# Patient Record
Sex: Male | Born: 1985 | Race: Black or African American | Hispanic: No | Marital: Single | Smoking: Never smoker
Health system: Southern US, Community
[De-identification: ages and names within clinical notes are randomized; demographics above are authoritative.]

## PROBLEM LIST (undated history)

## (undated) DIAGNOSIS — G43909 Migraine, unspecified, not intractable, without status migrainosus: Secondary | ICD-10-CM

## (undated) DIAGNOSIS — T7840XA Allergy, unspecified, initial encounter: Secondary | ICD-10-CM

---

## 2018-05-13 ENCOUNTER — Encounter: Payer: Self-pay | Admitting: Emergency Medicine

## 2018-05-13 ENCOUNTER — Emergency Department (INDEPENDENT_AMBULATORY_CARE_PROVIDER_SITE_OTHER)
Admission: EM | Admit: 2018-05-13 | Discharge: 2018-05-13 | Disposition: A | Discharge status: Home / Self Care | Payer: Worker's Compensation | Source: Home / Self Care | Attending: Family Medicine | Admitting: Family Medicine

## 2018-05-13 DIAGNOSIS — S29012A Strain of muscle and tendon of back wall of thorax, initial encounter: Secondary | ICD-10-CM

## 2018-05-13 DIAGNOSIS — M549 Dorsalgia, unspecified: Secondary | ICD-10-CM

## 2018-05-13 MED ORDER — MELOXICAM 15 MG PO TABS: 15.0000 mg | ORAL_TABLET | Freq: Every day | ORAL | 0 refills | Status: DC

## 2018-05-13 MED ORDER — METHOCARBAMOL 500 MG PO TABS: 500.0000 mg | ORAL_TABLET | Freq: Two times a day (BID) | ORAL | 0 refills | Status: DC

## 2018-05-13 NOTE — ED Triage Notes (Signed)
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

## 2018-05-13 NOTE — Discharge Instructions (Signed)
°  Meloxicam (Mobic) is an antiinflammatory to help with pain and inflammation.  Do not take ibuprofen, Advil, Aleve, or any other medications that contain NSAIDs while taking meloxicam as this may cause stomach upset or even ulcers if taken in large amounts for an extended period of time.   Robaxin (methocarbamol) is a muscle relaxer and may cause drowsiness. Do not drink alcohol, drive, or operate heavy machinery while taking.  Please call to schedule a follow up appointment with [facility] for reevaluation of symptoms to determine when you may return to work without restrictions.

## 2018-05-13 NOTE — ED Provider Notes (Signed)
[facility]    CSN: [id number] Arrival date & time: 05/13/18  [id number]     History   Chief Complaint Chief Complaint  Patient presents with  . Shoulder Pain    HPI Ian Jones is a 32 y.o. male.   HPI  Ian Jones is a 32 y.o. male presenting to UC with c/o Right shoulder pain around shoulder blade that started yesterday at work. Pt works for [employer] and was moving a pallet when he felt a tightness in his upper back.  He has taken Aleve but pain was worse this morning, he was unable to go to work due to the pain. Pain is 10/10 with certain movements, aching and sharp/cramping. Denies radiation of pain or numbness down Right arm. No prior hx of back injuries or surgeries.    History reviewed. No pertinent past medical history.  There are no active problems to display for this patient.   History reviewed. No pertinent surgical history.     Home Medications    Prior to Admission medications   Medication Sig Start Date End Date Taking? Authorizing Provider  Cetirizine HCl (ZYRTEC PO) Take by mouth.   Yes [provider]  fluticasone (FLONASE) 50 MCG/ACT nasal spray Place into both nostrils daily.   Yes [provider]  meloxicam (MOBIC) 15 MG tablet Take 1 tablet (15 mg total) by mouth daily. 05/13/18   Lurene ShadowPhelps, Shanise Balch O, PA-C  methocarbamol (ROBAXIN) 500 MG tablet Take 1 tablet (500 mg total) by mouth 2 (two) times daily. 05/13/18   Lurene ShadowPhelps, Deke Tilghman O, PA-C    Family History No family history on file.  Social History Social History   Tobacco Use  . Smoking status: Never Smoker  . Smokeless tobacco: Never Used  Substance Use Topics  . Alcohol use: Never    Frequency: Never  . Drug use: Never     Allergies   Patient has no known allergies.   Review of Systems Review of Systems  Musculoskeletal: Positive for arthralgias, myalgias and neck pain. Negative for joint swelling and neck stiffness.  Neurological: Negative for weakness and  numbness.     Physical Exam Triage Vital Signs ED Triage Vitals  Enc Vitals Group     BP 05/13/18 0953 (!) 141/83     Pulse Rate 05/13/18 0953 67     Resp --      Temp 05/13/18 0953 97.8 F (36.6 C)     Temp Source 05/13/18 0953 Oral     SpO2 05/13/18 0953 99 %     Weight 05/13/18 0954 (!) 322 lb (146.1 kg)     Height 05/13/18 0954 6\' 1"  (1.854 m)     Head Circumference --      Peak Flow --      Pain Score 05/13/18 0954 10     Pain Loc --      Pain Edu? --      Excl. in GC? --    No data found.  Updated Vital Signs BP (!) 141/83 (BP Location: Right Arm)   Pulse 67   Temp 97.8 F (36.6 C) (Oral)   Ht 6\' 1"  (1.854 m)   Wt (!) 322 lb (146.1 kg)   SpO2 99%   BMI 42.48 kg/m   Visual Acuity Right Eye Distance:   Left Eye Distance:   Bilateral Distance:    Right Eye Near:   Left Eye Near:    Bilateral Near:     Physical Exam  Constitutional: He  is oriented to person, place, and time. He appears well-developed and well-nourished.  HENT:  Head: Normocephalic and atraumatic.  Eyes: EOM are normal.  Neck: Normal range of motion. Neck supple.  No midline bone tenderness, no crepitus or step-offs.   Cardiovascular: Normal rate.  Pulses:      Radial pulses are 2+ on the right side.  Pulmonary/Chest: Effort normal.  Musculoskeletal: Normal range of motion. He exhibits tenderness. He exhibits no edema.  No midline spinal tenderness. Tenderness to Right side rhomboid muscles and Right upper trapezius.  Full ROM Right arm. No bony tenderness of Right shoulder.  5/5 strength in Right arm  Neurological: He is alert and oriented to person, place, and time.  Skin: Skin is warm and dry.  Psychiatric: He has a normal mood and affect. His behavior is normal.  Nursing note and vitals reviewed.    UC Treatments / Results  Labs (all labs ordered are listed, but only abnormal results are displayed) Labs Reviewed - No data to display  EKG None  Radiology No results  found.  Procedures Procedures (including critical care time)  Medications Ordered in UC Medications - No data to display  Initial Impression / Assessment and Plan / UC Course  I have reviewed the triage vital signs and the nursing notes.  Pertinent labs & imaging results that were available during my care of the patient were reviewed by me and considered in my medical decision making (see chart for details).     No imaging indicated at this time. Hx and exam c/w muscle strain. Home instructions provided. Pt may return to work tomorrow with light duty.  Pt to f/u with Employee Health at [facility] on Monday or Tuesday (9/23-9/24).  Final Clinical Impressions(s) / UC Diagnoses   Final diagnoses:  Upper back pain on right side  Rhomboid muscle strain, initial encounter     Discharge Instructions      Meloxicam (Mobic) is an antiinflammatory to help with pain and inflammation.  Do not take ibuprofen, Advil, Aleve, or any other medications that contain NSAIDs while taking meloxicam as this may cause stomach upset or even ulcers if taken in large amounts for an extended period of time.   Robaxin (methocarbamol) is a muscle relaxer and may cause drowsiness. Do not drink alcohol, drive, or operate heavy machinery while taking.  Please call to schedule a follow up appointment with Employee Health at [facility] for reevaluation of symptoms to determine when you may return to work without restrictions.     ED Prescriptions    Medication Sig Dispense Auth. Provider   methocarbamol (ROBAXIN) 500 MG tablet Take 1 tablet (500 mg total) by mouth 2 (two) times daily. 20 tablet Lurene Shadow, PA-C   meloxicam (MOBIC) 15 MG tablet Take 1 tablet (15 mg total) by mouth daily. 20 tablet Lurene Shadow, PA-C     Controlled Substance Prescriptions [place] Controlled Substance Registry consulted? Not Applicable   Rolla Plate 05/13/18 1136

## 2019-10-07 ENCOUNTER — Emergency Department
Admission: EM | Admit: 2019-10-07 | Discharge: 2019-10-07 | Discharge status: Against Medical Advice | Payer: Self-pay | Source: Home / Self Care

## 2019-10-07 ENCOUNTER — Other Ambulatory Visit: Payer: Self-pay

## 2019-11-22 ENCOUNTER — Encounter (HOSPITAL_COMMUNITY): Payer: Self-pay

## 2019-11-22 ENCOUNTER — Other Ambulatory Visit: Payer: Self-pay

## 2019-11-22 ENCOUNTER — Ambulatory Visit (HOSPITAL_COMMUNITY)
Admission: EM | Admit: 2019-11-22 | Discharge: 2019-11-22 | Disposition: A | Discharge status: Home / Self Care | Payer: Self-pay | Attending: Family Medicine | Admitting: Family Medicine

## 2019-11-22 DIAGNOSIS — S161XXA Strain of muscle, fascia and tendon at neck level, initial encounter: Secondary | ICD-10-CM

## 2019-11-22 MED ORDER — CYCLOBENZAPRINE HCL 10 MG PO TABS: 10.0000 mg | ORAL_TABLET | Freq: Two times a day (BID) | ORAL | 0 refills | Status: AC | PRN

## 2019-11-22 MED ORDER — IBUPROFEN 800 MG PO TABS: 800.0000 mg | ORAL_TABLET | Freq: Three times a day (TID) | ORAL | 0 refills | Status: AC
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
## 2020-01-27 ENCOUNTER — Emergency Department (INDEPENDENT_AMBULATORY_CARE_PROVIDER_SITE_OTHER): Payer: Commercial Managed Care - PPO

## 2020-01-27 ENCOUNTER — Emergency Department (INDEPENDENT_AMBULATORY_CARE_PROVIDER_SITE_OTHER)
Admission: EM | Admit: 2020-01-27 | Discharge: 2020-01-27 | Disposition: A | Discharge status: Home / Self Care | Payer: Commercial Managed Care - PPO | Source: Home / Self Care | Attending: Family Medicine | Admitting: Family Medicine

## 2020-01-27 ENCOUNTER — Other Ambulatory Visit: Payer: Self-pay

## 2020-01-27 DIAGNOSIS — R6884 Jaw pain: Secondary | ICD-10-CM

## 2020-01-27 DIAGNOSIS — M940 Chondrocostal junction syndrome [Tietze]: Secondary | ICD-10-CM | POA: Diagnosis not present

## 2020-01-27 DIAGNOSIS — M542 Cervicalgia: Secondary | ICD-10-CM | POA: Diagnosis not present

## 2020-01-27 DIAGNOSIS — R0789 Other chest pain: Secondary | ICD-10-CM

## 2020-01-27 MED ORDER — PREDNISONE 20 MG PO TABS: ORAL_TABLET | ORAL | 0 refills | Status: AC

## 2020-01-27 MED ORDER — PREDNISONE 20 MG PO TABS: ORAL_TABLET | ORAL | 0 refills | Status: DC

## 2020-01-27 NOTE — ED Triage Notes (Signed)
Pt c/o sharp pain in lower LT side of jaw, radiates down to shoulder blade since last night. Resolved this am. Has returned while working. Aleve prn. Pt was in [facility] March 1, says hes felt intermittent pain since then. Still currently in PT. Works for [employer]  and does strenuous activities at work.
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
## 2020-01-27 NOTE — Discharge Instructions (Addendum)
Apply ice pack to back of neck, and to upper left chest and sternum for 20 to 30 minutes, 2 to 3 times daily  Continue until pain decreases.   May take Tylenol as needed for pain.

## 2020-02-05 ENCOUNTER — Encounter: Payer: Commercial Managed Care - PPO | Admitting: Sports Medicine

## 2020-06-02 ENCOUNTER — Other Ambulatory Visit: Payer: Self-pay

## 2020-06-02 ENCOUNTER — Encounter (HOSPITAL_COMMUNITY): Payer: Self-pay | Admitting: Emergency Medicine

## 2020-06-02 ENCOUNTER — Emergency Department (HOSPITAL_COMMUNITY)
Admission: EM | Admit: 2020-06-02 | Discharge: 2020-06-03 | Disposition: A | Discharge status: Home / Self Care | Payer: Commercial Managed Care - PPO | Attending: Emergency Medicine | Admitting: Emergency Medicine

## 2020-06-02 DIAGNOSIS — R0981 Nasal congestion: Secondary | ICD-10-CM | POA: Diagnosis present

## 2020-06-02 DIAGNOSIS — U071 COVID-19: Secondary | ICD-10-CM | POA: Insufficient documentation

## 2020-06-02 DIAGNOSIS — R5383 Other fatigue: Secondary | ICD-10-CM

## 2020-06-02 LAB — RESPIRATORY PANEL BY RT PCR (FLU A&B, COVID)
Influenza A by PCR: NEGATIVE
Influenza B by PCR: NEGATIVE
SARS Coronavirus 2 by RT PCR: POSITIVE — AB

## 2020-06-02 MED ORDER — FLUTICASONE PROPIONATE 50 MCG/ACT NA SUSP: 2.0000 | Freq: Two times a day (BID) | NASAL | 0 refills | Status: AC

## 2020-06-02 MED ORDER — ONDANSETRON 4 MG PO TBDP
4.0000 mg | ORAL_TABLET | Freq: Once | ORAL | Status: AC
  Administered 2020-06-03: 4 mg via ORAL
  Filled 2020-06-02: qty 1

## 2020-06-02 MED ORDER — ONDANSETRON 8 MG PO TBDP: 8.0000 mg | ORAL_TABLET | Freq: Once | ORAL | Status: DC

## 2020-06-02 MED ORDER — ACETAMINOPHEN 325 MG PO TABS
650.0000 mg | ORAL_TABLET | Freq: Once | ORAL | Status: AC
  Administered 2020-06-03: 650 mg via ORAL
  Filled 2020-06-02: qty 2

## 2020-06-02 MED ORDER — CETIRIZINE HCL 10 MG PO TABS: 10.0000 mg | ORAL_TABLET | Freq: Every day | ORAL | 0 refills | Status: AC

## 2020-06-02 MED ORDER — ONDANSETRON 4 MG PO TBDP: 4.0000 mg | ORAL_TABLET | Freq: Three times a day (TID) | ORAL | 0 refills | Status: AC | PRN

## 2020-06-02 NOTE — ED Provider Notes (Addendum)
[facility]-EMERGENCY DEPT Provider Note   CSN: [id number] Arrival date & time: 06/02/20  2014     History Chief Complaint  Patient presents with  . Nasal Congestion  . Fatigue    Ian Jones is a 34 y.o. male.  HPI  Patient is overweight 34 year old male with no pertinent past medical history presented today with 3 days of congestion, some chills, some fatigue and myalgias.  Denies any shortness of breath.  States he is not coughing.  Denies any chest pain or lightheadedness or dizziness.  He states that he is not vaccinated for COVID-19.  He denies any fevers although he did say that he felt somewhat sweaty the other night and has had some chills.  He states he has had persistent nausea but no vomiting no abdominal pain no lightheadedness or dizziness.  He states he is otherwise feeling quite well.  Denies any aggravating or mitigating factors of his symptoms other than some improvement with his congestion when using fluticasone which he has used 1 spray in each nostril each morning.  He ran out yesterday morning and states that his congestion is a little worse today.  He has no known exposure to Covid.      History reviewed. No pertinent past medical history.  There are no problems to display for this patient.   History reviewed. No pertinent surgical history.     History reviewed. No pertinent family history.  Social History   Tobacco Use  . Smoking status: Never Smoker  . Smokeless tobacco: Never Used  Substance Use Topics  . Alcohol use: Never  . Drug use: Never    Home Medications Prior to Admission medications   Medication Sig Start Date End Date Taking? Authorizing Provider  cetirizine (ZYRTEC ALLERGY) 10 MG tablet Take 1 tablet (10 mg total) by mouth daily for 14 days. 06/02/20 06/16/20  Gailen Shelter, PA  cyclobenzaprine (FLEXERIL) 10 MG tablet Take 1 tablet (10 mg total) by mouth 2 (two) times daily as needed for muscle spasms.  11/22/19   Bast, Gloris Manchester A, NP  fluticasone (FLONASE) 50 MCG/ACT nasal spray Place 2 sprays into both nostrils in the morning and at bedtime for 14 days. 06/02/20 06/16/20  Gailen Shelter, PA  ibuprofen (ADVIL) 800 MG tablet Take 1 tablet (800 mg total) by mouth 3 (three) times daily. 11/22/19   Bast, Gloris Manchester A, NP  ondansetron (ZOFRAN ODT) 4 MG disintegrating tablet Take 1 tablet (4 mg total) by mouth every 8 (eight) hours as needed for nausea or vomiting. 06/02/20   Gailen Shelter, PA  predniSONE (DELTASONE) 20 MG tablet Take one tab by mouth twice daily for 4 days, then one daily for 3 days. Take with food. 01/27/20   Lattie Haw, MD    Allergies    Patient has no known allergies.  Review of Systems   Review of Systems  Constitutional: Positive for chills and fatigue. Negative for fever.  HENT: Positive for congestion.   Eyes: Negative for pain.  Respiratory: Negative for cough and shortness of breath.   Cardiovascular: Negative for chest pain and leg swelling.  Gastrointestinal: Positive for nausea. Negative for abdominal pain, diarrhea and vomiting.  Genitourinary: Negative for dysuria.  Musculoskeletal: Positive for myalgias.  Skin: Negative for rash.  Neurological: Negative for dizziness and headaches.    Physical Exam Updated Vital Signs BP (!) 135/93 (BP Location: Left Arm)   Pulse 96   Temp 99.9 F (37.7 C) (Oral)  Resp 16   Wt (!) 139.7 kg   SpO2 98%   BMI 40.64 kg/m   Physical Exam Vitals and nursing note reviewed.  Constitutional:      General: He is not in acute distress.    Appearance: He is obese. He is not ill-appearing.  HENT:     Head: Normocephalic and atraumatic.     Nose: Congestion present.  Eyes:     General: No scleral icterus. Cardiovascular:     Rate and Rhythm: Normal rate and regular rhythm.     Pulses: Normal pulses.     Heart sounds: Normal heart sounds.  Pulmonary:     Effort: Pulmonary effort is normal. No respiratory distress.      Breath sounds: Normal breath sounds. No wheezing, rhonchi or rales.  Abdominal:     Palpations: Abdomen is soft.     Tenderness: There is no abdominal tenderness. There is no right CVA tenderness, left CVA tenderness, guarding or rebound.  Musculoskeletal:     Cervical back: Normal range of motion.     Right lower leg: No edema.     Left lower leg: No edema.  Skin:    General: Skin is warm and dry.     Capillary Refill: Capillary refill takes less than 2 seconds.  Neurological:     Mental Status: He is alert. Mental status is at baseline.  Psychiatric:        Mood and Affect: Mood normal.        Behavior: Behavior normal.     ED Results / Procedures / Treatments   Labs (all labs ordered are listed, but only abnormal results are displayed) Labs Reviewed  RESPIRATORY PANEL BY RT PCR (FLU A&B, COVID)    EKG None  Radiology No results found.  Procedures Procedures (including critical care time)  Medications Ordered in ED Medications  ondansetron (ZOFRAN-ODT) disintegrating tablet 4 mg (has no administration in time range)    ED Course  I have reviewed the triage vital signs and the nursing notes.  Pertinent labs & imaging results that were available during my care of the patient were reviewed by me and considered in my medical decision making (see chart for details).  Patient with 3 days of congestion, some chills, some fatigue and myalgias.  Denies any shortness of breath.  States he is not coughing.  Denies any chest pain or lightheadedness or dizziness.  Physical exam is unremarkable.  He is well-appearing is overweight.  Signs without abnormality apart from very mild hypertension.  Patient given Tylenol and Zofran.  He will be discharged with conservative therapy including Zofran, fluticasone, Zyrtec as well as Tylenol ibuprofen recommendations.  Clinical Course as of Jun 02 2329  Mon Jun 02, 2020  2329 COVID positive. No hypoxia.  Patient ambulated without  dyspnea. Mild elevation in temperature.  Patient will take Tylenol and ibuprofen.  Respiratory Panel by RT PCR (Flu A&B, Covid) - Nasopharyngeal Swab(!) [WF]    Clinical Course User Index [WF] Gailen Shelter, [place]   MDM Rules/Calculators/A&P                          Ian Jones was evaluated in Emergency Department on 06/02/2020 for the symptoms described in the history of present illness. He was evaluated in the context of the global COVID-19 pandemic, which necessitated consideration that the patient might be at risk for infection with the SARS-CoV-2 virus that causes COVID-19. Institutional protocols and  algorithms that pertain to the evaluation of patients at risk for COVID-19 are in a state of rapid change based on information released by regulatory bodies including the CDC and federal and state organizations. These policies and algorithms were followed during the patient's care in the ED.  Discussed with patient the option for MAB infusion.  He states he would like to be considered for this.  Will reach out to MAB infusion center.  Given strict return precautions.  Final Clinical Impression(s) / ED Diagnoses Final diagnoses:  Fatigue, unspecified type  Nasal congestion  Suspected COVID-19 virus infection    Rx / DC Orders ED Discharge Orders         Ordered    ondansetron (ZOFRAN ODT) 4 MG disintegrating tablet  Every 8 hours PRN        06/02/20 2320    fluticasone (FLONASE) 50 MCG/ACT nasal spray  2 times daily        06/02/20 2320    cetirizine (ZYRTEC ALLERGY) 10 MG tablet  Daily        06/02/20 2320           Gailen Shelter, [place] 06/02/20 2332    Gailen Shelter, [place] 06/02/20 2337    Benjiman Core, MD 06/03/20 [phone]

## 2020-06-02 NOTE — ED Triage Notes (Signed)
Pt states, "I have had flu like symptoms" since Saturday. He reports post nasal drip, body aches and fatigue. Denies sick contacts. Not vaccinated against COVID 19.

## 2020-06-02 NOTE — ED Notes (Signed)
Date and time results received: 06/02/20 11:24 PM  (use smartphrase ".now" to insert current time)  Test: SARS Covid Critical Value: Positive  Name of Provider Notified: Dr.Pickering  Orders Received? Or Actions Taken?:

## 2020-06-02 NOTE — Discharge Instructions (Addendum)
Your COVID test is pending; you should expect results in 2-3 hours. You can access your results on your MyChart--if you test positive you should receive a phone call.  In the meantime follow CDC guidelines and quarantine, wear a mask, wash hands often.   Please use Tylenol or ibuprofen for pain.  You may use 600 mg ibuprofen every 6 hours or 1000 mg of Tylenol every 6 hours.  You may choose to alternate between the 2.  This would be most effective.  Not to exceed 4 g of Tylenol within 24 hours.  Not to exceed 3200 mg ibuprofen 24 hours.   Please use the Zyrtec, Flonase and Zyrtec that I prescribed as needed for your symptoms.  Please take over the counter vitamin D 2000-4000 units per day. I also recommend zinc 50 mg per day for the next two weeks.   Please return to ED if you feel have difficulty breathing or have emergent, new or concerning symptoms.  Patients who have symptoms consistent with COVID-19 should self isolated for: At least 3 days (72 hours) have passed since recovery, defined as resolution of fever without the use of fever reducing medications and improvement in respiratory symptoms (e.g., cough, shortness of breath), and At least 7 days have passed since symptoms first appeared.       Person Under Monitoring Name: Ian Jones  Location: [address] [place] [address]   Infection Prevention Recommendations for Individuals Confirmed to have, or Being Evaluated for, 2019 Novel Coronavirus (COVID-19) Infection Who Receive Care at Home  Individuals who are confirmed to have, or are being evaluated for, COVID-19 should follow the prevention steps below until a healthcare provider or local or state health department says they can return to normal activities.  Stay home except to get medical care You should restrict activities outside your home, except for getting medical care. Do not go to work, school, or public areas, and do not use public  transportation or taxis.  Call ahead before visiting your doctor Before your medical appointment, call the healthcare provider and tell them that you have, or are being evaluated for, COVID-19 infection. This will help the healthcare provider's office take steps to keep other people from getting infected. Ask your healthcare provider to call the local or state health department.  Monitor your symptoms Seek prompt medical attention if your illness is worsening (e.g., difficulty breathing). Before going to your medical appointment, call the healthcare provider and tell them that you have, or are being evaluated for, COVID-19 infection. Ask your healthcare provider to call the local or state health department.  Wear a facemask You should wear a facemask that covers your nose and mouth when you are in the same room with other people and when you visit a healthcare provider. People who live with or visit you should also wear a facemask while they are in the same room with you.  Separate yourself from other people in your home As much as possible, you should stay in a different room from other people in your home. Also, you should use a separate bathroom, if available.  Avoid sharing household items You should not share dishes, drinking glasses, cups, eating utensils, towels, bedding, or other items with other people in your home. After using these items, you should wash them thoroughly with soap and water.  Cover your coughs and sneezes Cover your mouth and nose with a tissue when you cough or sneeze, or you can cough or  sneeze into your sleeve. Throw used tissues in a lined trash can, and immediately wash your hands with soap and water for at least 20 seconds or use an alcohol-based hand rub.  Wash your [employer] your hands often and thoroughly with soap and water for at least 20 seconds. You can use an alcohol-based hand sanitizer if soap and water are not available and if your hands  are not visibly dirty. Avoid touching your eyes, nose, and mouth with unwashed hands.   Prevention Steps for Caregivers and Household Members of Individuals Confirmed to have, or Being Evaluated for, COVID-19 Infection Being Cared for in the Home  If you live with, or provide care at home for, a person confirmed to have, or being evaluated for, COVID-19 infection please follow these guidelines to prevent infection:  Follow healthcare provider's instructions Make sure that you understand and can help the patient follow any healthcare provider instructions for all care.  Provide for the patient's basic needs You should help the patient with basic needs in the home and provide support for getting groceries, prescriptions, and other personal needs.  Monitor the patient's symptoms If they are getting sicker, call his or her medical provider and tell them that the patient has, or is being evaluated for, COVID-19 infection. This will help the healthcare provider's office take steps to keep other people from getting infected. Ask the healthcare provider to call the local or state health department.  Limit the number of people who have contact with the patient If possible, have only one caregiver for the patient. Other household members should stay in another home or place of residence. If this is not possible, they should stay in another room, or be separated from the patient as much as possible. Use a separate bathroom, if available. Restrict visitors who do not have an essential need to be in the home.  Keep older adults, very young children, and other sick people away from the patient Keep older adults, very young children, and those who have compromised immune systems or chronic health conditions away from the patient. This includes people with chronic heart, lung, or kidney conditions, diabetes, and cancer.  Ensure good ventilation Make sure that shared spaces in the home have good air  flow, such as from an air conditioner or an opened window, weather permitting.  Wash your hands often Wash your hands often and thoroughly with soap and water for at least 20 seconds. You can use an alcohol based hand sanitizer if soap and water are not available and if your hands are not visibly dirty. Avoid touching your eyes, nose, and mouth with unwashed hands. Use disposable paper towels to dry your hands. If not available, use dedicated cloth towels and replace them when they become wet.  Wear a facemask and gloves Wear a disposable facemask at all times in the room and gloves when you touch or have contact with the patient's blood, body fluids, and/or secretions or excretions, such as sweat, saliva, sputum, nasal mucus, vomit, urine, or feces.  Ensure the mask fits over your nose and mouth tightly, and do not touch it during use. Throw out disposable facemasks and gloves after using them. Do not reuse. Wash your hands immediately after removing your facemask and gloves. If your personal clothing becomes contaminated, carefully remove clothing and launder. Wash your hands after handling contaminated clothing. Place all used disposable facemasks, gloves, and other waste in a lined container before disposing them with other household waste. Remove  gloves and wash your hands immediately after handling these items.  Do not share dishes, glasses, or other household items with the patient Avoid sharing household items. You should not share dishes, drinking glasses, cups, eating utensils, towels, bedding, or other items with a patient who is confirmed to have, or being evaluated for, COVID-19 infection. After the person uses these items, you should wash them thoroughly with soap and water.  Wash laundry thoroughly Immediately remove and wash clothes or bedding that have blood, body fluids, and/or secretions or excretions, such as sweat, saliva, sputum, nasal mucus, vomit, urine, or feces, on  them. Wear gloves when handling laundry from the patient. Read and follow directions on labels of laundry or clothing items and detergent. In general, wash and dry with the warmest temperatures recommended on the label.  Clean all areas the individual has used often Clean all touchable surfaces, such as counters, tabletops, doorknobs, bathroom fixtures, toilets, phones, keyboards, tablets, and bedside tables, every day. Also, clean any surfaces that may have blood, body fluids, and/or secretions or excretions on them. Wear gloves when cleaning surfaces the patient has come in contact with. Use a diluted bleach solution (e.g., dilute bleach with 1 part bleach and 10 parts water) or a household disinfectant with a label that says EPA-registered for coronaviruses. To make a bleach solution at home, add 1 tablespoon of bleach to 1 quart (4 cups) of water. For a larger supply, add  cup of bleach to 1 gallon (16 cups) of water. Read labels of cleaning products and follow recommendations provided on product labels. Labels contain instructions for safe and effective use of the cleaning product including precautions you should take when applying the product, such as wearing gloves or eye protection and making sure you have good ventilation during use of the product. Remove gloves and wash hands immediately after cleaning.  Monitor yourself for signs and symptoms of illness Caregivers and household members are considered close contacts, should monitor their health, and will be asked to limit movement outside of the home to the extent possible. Follow the monitoring steps for close contacts listed on the symptom monitoring form.   ? If you have additional questions, contact your local health department or call the epidemiologist on call at [phone] (available 24/7). ? This guidance is subject to change. For the most up-to-date guidance from [facility], please refer to their  website: [email or web address]

## 2020-06-03 ENCOUNTER — Encounter: Payer: Self-pay | Admitting: Nurse Practitioner

## 2020-06-03 ENCOUNTER — Other Ambulatory Visit: Payer: Self-pay | Admitting: Nurse Practitioner

## 2020-06-03 ENCOUNTER — Telehealth (HOSPITAL_COMMUNITY): Payer: Self-pay

## 2020-06-03 DIAGNOSIS — U071 COVID-19: Secondary | ICD-10-CM

## 2020-06-03 DIAGNOSIS — Z6841 Body Mass Index (BMI) 40.0 and over, adult: Secondary | ICD-10-CM

## 2020-06-03 NOTE — Telephone Encounter (Signed)
Called to Discuss with patient about Covid symptoms and the use of the monoclonal antibody infusion for those with mild to moderate Covid symptoms and at a high risk of hospitalization.     Pt appears to qualify for this infusion due to co-morbid conditions and/or a member of an at-risk group in accordance with the FDA Emergency Use Authorization.      

## 2020-06-03 NOTE — Progress Notes (Signed)
I connected by phone with Ian Jones on 06/03/2020 at 11:08 AM to discuss the potential use of a new treatment for mild to moderate COVID-19 viral infection in non-hospitalized patients.  This patient is a 34 y.o. male that meets the FDA criteria for Emergency Use Authorization of COVID monoclonal antibody casirivimab/imdevimab or bamlanivimab/eteseviamb.  Has a (+) direct SARS-CoV-2 viral test result  Has mild or moderate COVID-19   Is NOT hospitalized due to COVID-19  Is within 10 days of symptom onset  Has at least one of the high risk factor(s) for progression to severe COVID-19 and/or hospitalization as defined in EUA.  Specific high risk criteria : BMI > 25   Sx onset 10/8   I have spoken and communicated the following to the patient or parent/caregiver regarding COVID monoclonal antibody treatment:  1. FDA has authorized the emergency use for the treatment of mild to moderate COVID-19 in adults and pediatric patients with positive results of direct SARS-CoV-2 viral testing who are 77 years of age and older weighing at least 40 kg, and who are at high risk for progressing to severe COVID-19 and/or hospitalization.  2. The significant known and potential risks and benefits of COVID monoclonal antibody, and the extent to which such potential risks and benefits are unknown.  3. Information on available alternative treatments and the risks and benefits of those alternatives, including clinical trials.  4. Patients treated with COVID monoclonal antibody should continue to self-isolate and use infection control measures (e.g., wear mask, isolate, social distance, avoid sharing personal items, clean and disinfect "high touch" surfaces, and frequent handwashing) according to CDC guidelines.   5. The patient or parent/caregiver has the option to accept or refuse COVID monoclonal antibody treatment.  After reviewing this information with the patient, the patient has agreed to receive one  of the available covid 19 monoclonal antibodies and will be provided an appropriate fact sheet prior to infusion. Tollie Eth, NP 06/03/2020 11:08 AM

## 2020-06-04 ENCOUNTER — Ambulatory Visit (HOSPITAL_COMMUNITY)
Admission: RE | Admit: 2020-06-04 | Discharge: 2020-06-04 | Disposition: A | Discharge status: Home / Self Care | Payer: Commercial Managed Care - PPO | Source: Ambulatory Visit | Attending: Pulmonary Disease | Admitting: Pulmonary Disease

## 2020-06-04 DIAGNOSIS — U071 COVID-19: Secondary | ICD-10-CM | POA: Insufficient documentation

## 2020-06-04 DIAGNOSIS — Z6841 Body Mass Index (BMI) 40.0 and over, adult: Secondary | ICD-10-CM | POA: Insufficient documentation

## 2020-06-04 MED ORDER — SODIUM CHLORIDE 0.9 % IV SOLN: INTRAVENOUS | Status: DC | PRN

## 2020-06-04 MED ORDER — METHYLPREDNISOLONE SODIUM SUCC 125 MG IJ SOLR: 125.0000 mg | Freq: Once | INTRAMUSCULAR | Status: DC | PRN

## 2020-06-04 MED ORDER — ALBUTEROL SULFATE HFA 108 (90 BASE) MCG/ACT IN AERS: 2.0000 | INHALATION_SPRAY | Freq: Once | RESPIRATORY_TRACT | Status: DC | PRN

## 2020-06-04 MED ORDER — EPINEPHRINE 0.3 MG/0.3ML IJ SOAJ: 0.3000 mg | Freq: Once | INTRAMUSCULAR | Status: DC | PRN

## 2020-06-04 MED ORDER — DIPHENHYDRAMINE HCL 50 MG/ML IJ SOLN: 50.0000 mg | Freq: Once | INTRAMUSCULAR | Status: DC | PRN

## 2020-06-04 MED ORDER — FAMOTIDINE IN NACL 20-0.9 MG/50ML-% IV SOLN: 20.0000 mg | Freq: Once | INTRAVENOUS | Status: DC | PRN

## 2020-06-04 MED ORDER — SODIUM CHLORIDE 0.9 % IV SOLN: Freq: Once | INTRAVENOUS | Status: AC

## 2020-06-04 NOTE — Discharge Instructions (Signed)

## 2020-06-04 NOTE — Progress Notes (Signed)
  Diagnosis: COVID-19  Physician:Dr Wright  Procedure: Covid Infusion Clinic Med: bamlanivimab\etesevimab infusion - Provided patient with bamlanimivab\etesevimab fact sheet for patients, parents and caregivers prior to infusion.  Complications: No immediate complications noted.  Discharge: Discharged home   Gelene Recktenwald W 06/04/2020  

## 2020-07-09 ENCOUNTER — Other Ambulatory Visit: Payer: Self-pay

## 2020-07-09 ENCOUNTER — Encounter (HOSPITAL_BASED_OUTPATIENT_CLINIC_OR_DEPARTMENT_OTHER): Payer: Self-pay | Admitting: Emergency Medicine

## 2020-07-09 ENCOUNTER — Emergency Department (HOSPITAL_BASED_OUTPATIENT_CLINIC_OR_DEPARTMENT_OTHER)
Admission: EM | Admit: 2020-07-09 | Discharge: 2020-07-09 | Disposition: A | Discharge status: Home / Self Care | Payer: Commercial Managed Care - PPO | Attending: Emergency Medicine | Admitting: Emergency Medicine

## 2020-07-09 DIAGNOSIS — G43909 Migraine, unspecified, not intractable, without status migrainosus: Secondary | ICD-10-CM

## 2020-07-09 DIAGNOSIS — R519 Headache, unspecified: Secondary | ICD-10-CM | POA: Diagnosis present

## 2020-07-09 HISTORY — DX: Allergy, unspecified, initial encounter: T78.40XA

## 2020-07-09 HISTORY — DX: Migraine, unspecified, not intractable, without status migrainosus: G43.909

## 2020-07-09 MED ORDER — METOCLOPRAMIDE HCL 5 MG/ML IJ SOLN
10.0000 mg | Freq: Once | INTRAMUSCULAR | Status: AC
  Administered 2020-07-09: 10 mg via INTRAVENOUS
  Filled 2020-07-09: qty 2

## 2020-07-09 MED ORDER — SODIUM CHLORIDE 0.9 % IV BOLUS
1000.0000 mL | Freq: Once | INTRAVENOUS | Status: AC
  Administered 2020-07-09: 1000 mL via INTRAVENOUS

## 2020-07-09 MED ORDER — SUMATRIPTAN SUCCINATE 100 MG PO TABS: ORAL_TABLET | ORAL | 0 refills | Status: AC

## 2020-07-09 MED ORDER — KETOROLAC TROMETHAMINE 15 MG/ML IJ SOLN
15.0000 mg | Freq: Once | INTRAMUSCULAR | Status: AC
  Administered 2020-07-09: 15 mg via INTRAVENOUS
  Filled 2020-07-09: qty 1

## 2020-07-09 MED ORDER — DIPHENHYDRAMINE HCL 50 MG/ML IJ SOLN
12.5000 mg | Freq: Once | INTRAMUSCULAR | Status: AC
  Administered 2020-07-09: 12.5 mg via INTRAVENOUS
  Filled 2020-07-09: qty 1

## 2020-07-09 MED ORDER — SUMATRIPTAN SUCCINATE 100 MG PO TABS: ORAL_TABLET | ORAL | 0 refills | Status: DC

## 2020-07-09 NOTE — ED Provider Notes (Signed)
[facility] EMERGENCY DEPARTMENT Provider Note   CSN: [id number] Arrival date & time: 07/09/20  1025     History Chief Complaint  Patient presents with  . Migraine    Ian Jones is a 34 y.o. male.  HPI   Pt is a 34 y/o male with a h/o allergies, migraines who presents to the ED today for eval of headache that started yesterday. States pain located to the top of his head. He has associated photophobia and nausea without vomiting. States pain is rated at a 7-8/10. He tried taking excedrin yesterday which mildly improved sxs but sxs are not completely resolved. Denies associated vision changes, numbness, weakness.  States he is typically on sumatriptan for his headaches but he ran out 2 weeks ago.   Past Medical History:  Diagnosis Date  . Allergies   . Migraine     There are no problems to display for this patient.   History reviewed. No pertinent surgical history.     No family history on file.  Social History   Tobacco Use  . Smoking status: Never Smoker  . Smokeless tobacco: Never Used  Substance Use Topics  . Alcohol use: Never  . Drug use: Never    Home Medications Prior to Admission medications   Medication Sig Start Date End Date Taking? Authorizing Provider  cetirizine (ZYRTEC ALLERGY) 10 MG tablet Take 1 tablet (10 mg total) by mouth daily for 14 days. 06/02/20 07/09/20 Yes Fondaw, Wylder S, PA  fluticasone (FLONASE) 50 MCG/ACT nasal spray Place 2 sprays into both nostrils in the morning and at bedtime for 14 days. 06/02/20 07/09/20 Yes Fondaw, Wylder S, PA  cyclobenzaprine (FLEXERIL) 10 MG tablet Take 1 tablet (10 mg total) by mouth 2 (two) times daily as needed for muscle spasms. 11/22/19   Dahlia Byes A, NP  ibuprofen (ADVIL) 800 MG tablet Take 1 tablet (800 mg total) by mouth 3 (three) times daily. 11/22/19   Bast, Gloris Manchester A, NP  ondansetron (ZOFRAN ODT) 4 MG disintegrating tablet Take 1 tablet (4 mg total) by mouth every 8 (eight) hours as  needed for nausea or vomiting. 06/02/20   Gailen Shelter, PA  predniSONE (DELTASONE) 20 MG tablet Take one tab by mouth twice daily for 4 days, then one daily for 3 days. Take with food. 01/27/20   Lattie Haw, MD  SUMAtriptan (IMITREX) 100 MG tablet Take 1 tablet at the onset of a headache. May repeat in 2 hours if headache persists or recurs. Do not take more than 2 tablets in a day. 07/09/20   Zylen Wenig S, PA-C    Allergies    Patient has no known allergies.  Review of Systems   Review of Systems  Constitutional: Negative for fever.  HENT: Negative for ear pain and sore throat.   Eyes: Positive for photophobia. Negative for pain and visual disturbance.  Respiratory: Negative for cough and shortness of breath.   Cardiovascular: Negative for chest pain.  Gastrointestinal: Positive for nausea. Negative for abdominal pain, constipation, diarrhea and vomiting.  Genitourinary: Negative for dysuria and hematuria.  Musculoskeletal: Negative for back pain.  Skin: Negative for rash.  Neurological: Positive for headaches. Negative for dizziness, speech difficulty, weakness, light-headedness and numbness.  All other systems reviewed and are negative.   Physical Exam Updated Vital Signs BP 119/82 (BP Location: Right Arm)   Pulse 79   Temp 98.3 F (36.8 C) (Oral)   Resp 18   Ht 6' (1.829 m)  Wt (!) 145.2 kg   SpO2 98%   BMI 43.40 kg/m   Physical Exam Vitals and nursing note reviewed.  Constitutional:      Appearance: He is well-developed.  HENT:     Head: Normocephalic and atraumatic.  Eyes:     Conjunctiva/sclera: Conjunctivae normal.  Cardiovascular:     Rate and Rhythm: Normal rate and regular rhythm.     Heart sounds: No murmur heard.   Pulmonary:     Effort: Pulmonary effort is normal. No respiratory distress.     Breath sounds: Normal breath sounds.  Abdominal:     Palpations: Abdomen is soft.     Tenderness: There is no abdominal tenderness.    Musculoskeletal:     Cervical back: Neck supple.  Skin:    General: Skin is warm and dry.  Neurological:     Mental Status: He is alert.     Comments: Mental Status:  Alert, thought content appropriate, able to give a coherent history. Speech fluent without evidence of aphasia. Able to follow 2 step commands without difficulty.  Cranial Nerves:  II: pupils equal, round, reactive to light III,IV, VI: ptosis not present, extra-ocular motions intact bilaterally  V,VII: smile symmetric, facial light touch sensation equal VIII: hearing grossly normal to voice  X: uvula elevates symmetrically  XI: bilateral shoulder shrug symmetric and strong XII: midline tongue extension without fassiculations Motor:  Normal tone. 5/5 strength of BUE and BLE major muscle groups including strong and equal grip strength and dorsiflexion/plantar flexion Sensory: light touch normal in all extremities.      ED Results / Procedures / Treatments   Labs (all labs ordered are listed, but only abnormal results are displayed) Labs Reviewed - No data to display  EKG None  Radiology No results found.  Procedures Procedures (including critical care time)  Medications Ordered in ED Medications  ketorolac (TORADOL) 15 MG/ML injection 15 mg (15 mg Intravenous Given 07/09/20 1121)  metoCLOPramide (REGLAN) injection 10 mg (10 mg Intravenous Given 07/09/20 1121)  diphenhydrAMINE (BENADRYL) injection 12.5 mg (12.5 mg Intravenous Given 07/09/20 1122)  sodium chloride 0.9 % bolus 1,000 mL (1,000 mLs Intravenous New Bag/Given 07/09/20 1120)    ED Course  I have reviewed the triage vital signs and the nursing notes.  Pertinent labs & imaging results that were available during my care of the patient were reviewed by me and considered in my medical decision making (see chart for details).    MDM Rules/Calculators/A&P                          Pt HA treated and improved while in ED.  Presentation is like pts  typical HA and non concerning for [facility], ICH, Meningitis, or temporal arteritis. Pt is afebrile with no focal neuro deficits, nuchal rigidity, or change in vision. Pt is to follow up with PCP to discuss prophylactic medication. In the meantime I will refill his rx for sumatriptan. Pt verbalizes understanding and is agreeable with plan to dc.    Final Clinical Impression(s) / ED Diagnoses Final diagnoses:  Migraine without status migrainosus, not intractable, unspecified migraine type    Rx / DC Orders ED Discharge Orders         Ordered    SUMAtriptan (IMITREX) 100 MG tablet        07/09/20 1253           Yvett Rossel S, PA-C 07/09/20 1254    Pricilla Loveless,  MD 07/10/20 1038

## 2020-07-09 NOTE — ED Notes (Signed)
He tells me he feels "Much better".

## 2020-07-09 NOTE — Discharge Instructions (Signed)
Take sumatriptan as directed.  You will need to make an appointment to follow-up with your PCP for further medication refills and to reassess your symptoms they came to the emergency department for today.  Please make an appoint within the next 5 to 7 days for reassessment.  Please return the emergency department for any new or worsening symptoms including any severe headaches, strokelike symptoms including any numbness, weakness, visual changes or other concerns.

## 2020-07-09 NOTE — ED Triage Notes (Addendum)
States," I have a migraine and it started yesterday" COVID x 1 month ago

## 2021-11-07 ENCOUNTER — Encounter (HOSPITAL_BASED_OUTPATIENT_CLINIC_OR_DEPARTMENT_OTHER): Payer: Self-pay | Admitting: Pediatrics

## 2021-11-07 ENCOUNTER — Emergency Department (HOSPITAL_BASED_OUTPATIENT_CLINIC_OR_DEPARTMENT_OTHER)
Admission: EM | Admit: 2021-11-07 | Discharge: 2021-11-07 | Disposition: A | Discharge status: Home / Self Care | Payer: Commercial Managed Care - PPO | Attending: Emergency Medicine | Admitting: Emergency Medicine

## 2021-11-07 ENCOUNTER — Other Ambulatory Visit: Payer: Self-pay

## 2021-11-07 DIAGNOSIS — R0981 Nasal congestion: Secondary | ICD-10-CM | POA: Diagnosis not present

## 2021-11-07 DIAGNOSIS — Z5321 Procedure and treatment not carried out due to patient leaving prior to being seen by health care provider: Secondary | ICD-10-CM | POA: Diagnosis not present

## 2021-11-07 DIAGNOSIS — R112 Nausea with vomiting, unspecified: Secondary | ICD-10-CM | POA: Diagnosis not present

## 2021-11-07 DIAGNOSIS — R1011 Right upper quadrant pain: Secondary | ICD-10-CM | POA: Insufficient documentation

## 2021-11-07 DIAGNOSIS — Z20822 Contact with and (suspected) exposure to covid-19: Secondary | ICD-10-CM | POA: Diagnosis not present

## 2021-11-07 DIAGNOSIS — R6883 Chills (without fever): Secondary | ICD-10-CM | POA: Insufficient documentation

## 2021-11-07 LAB — RESP PANEL BY RT-PCR (FLU A&B, COVID) ARPGX2
Influenza A by PCR: NEGATIVE
Influenza B by PCR: NEGATIVE
SARS Coronavirus 2 by RT PCR: NEGATIVE

## 2021-11-07 NOTE — ED Triage Notes (Addendum)
C/O nausea, chills and some sharp abdominal pain on the RUQ started today while working. Patient reported recently has some cold like symptoms and come congestion and when he vomits, mucous like comes up.  ?

## 2021-12-13 IMAGING — DX DG CERVICAL SPINE COMPLETE 4+V
6 series · 6 of 6 positions shown · non-contrast
Comparison: None.

CLINICAL DATA: Left jaw and neck pain for 1 month

EXAM:
CERVICAL SPINE - COMPLETE 4+ VIEW

[c-spine lat]
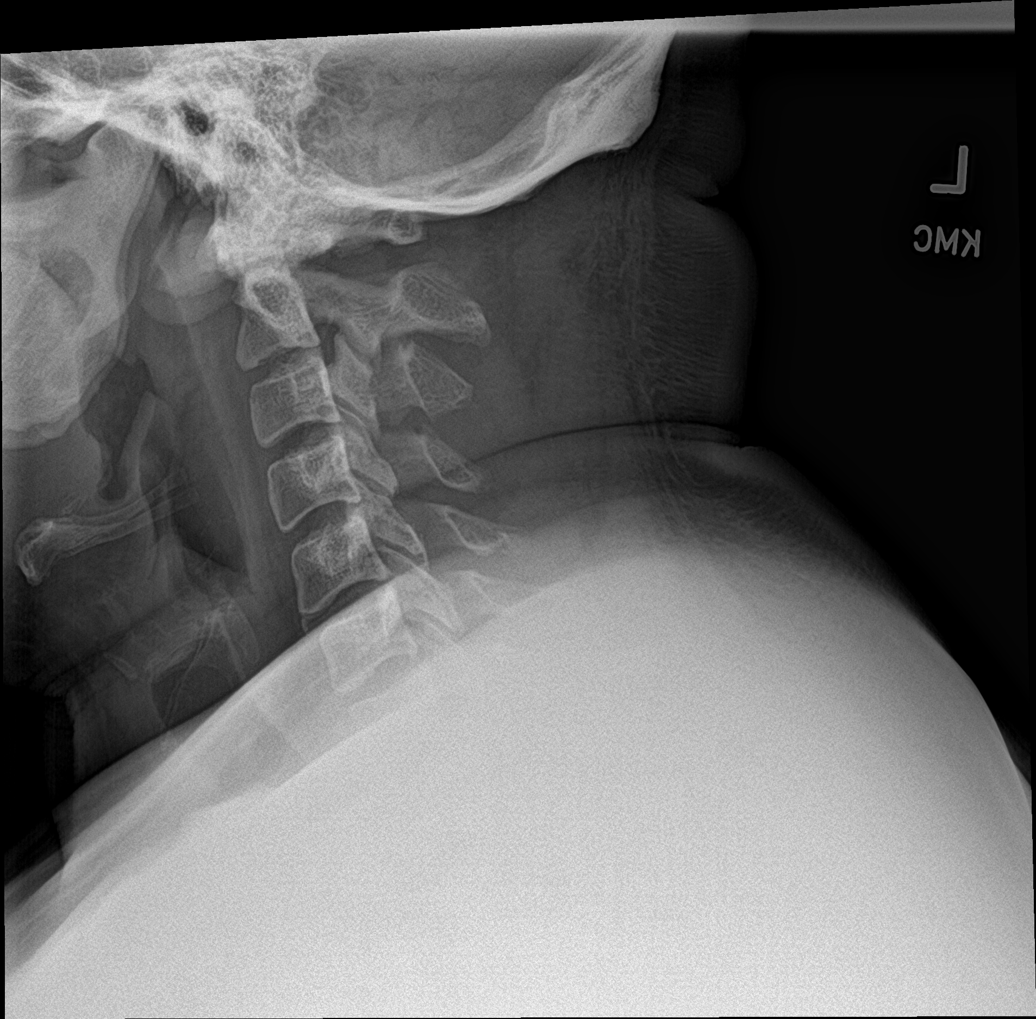

[c-spine obl (1 of 2)]
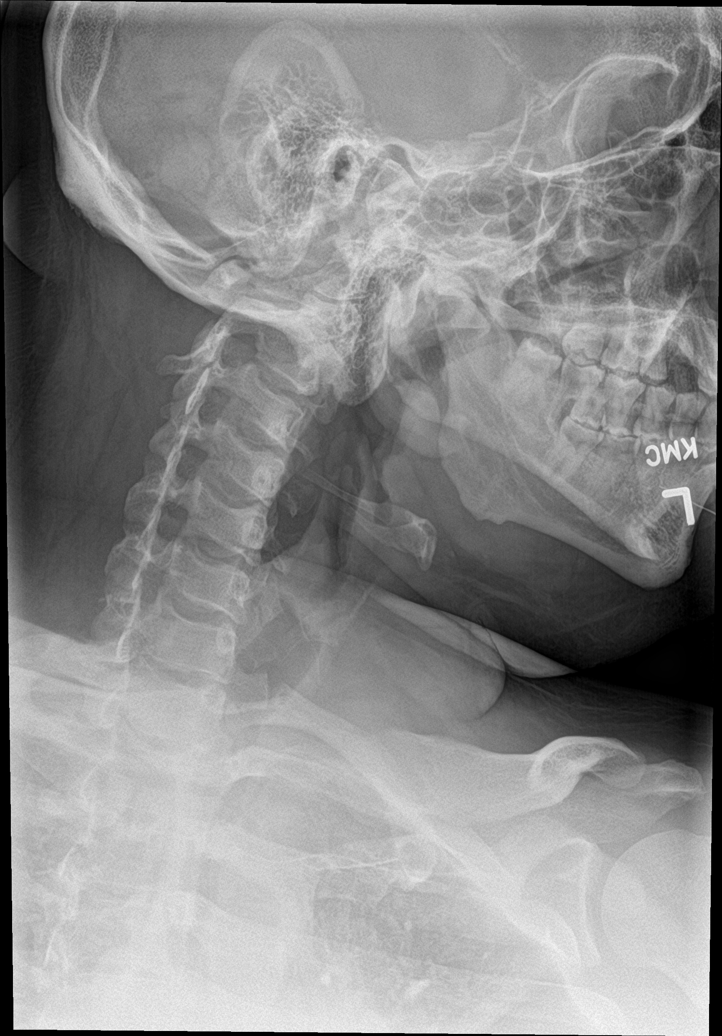

[c-spine obl (2 of 2)]
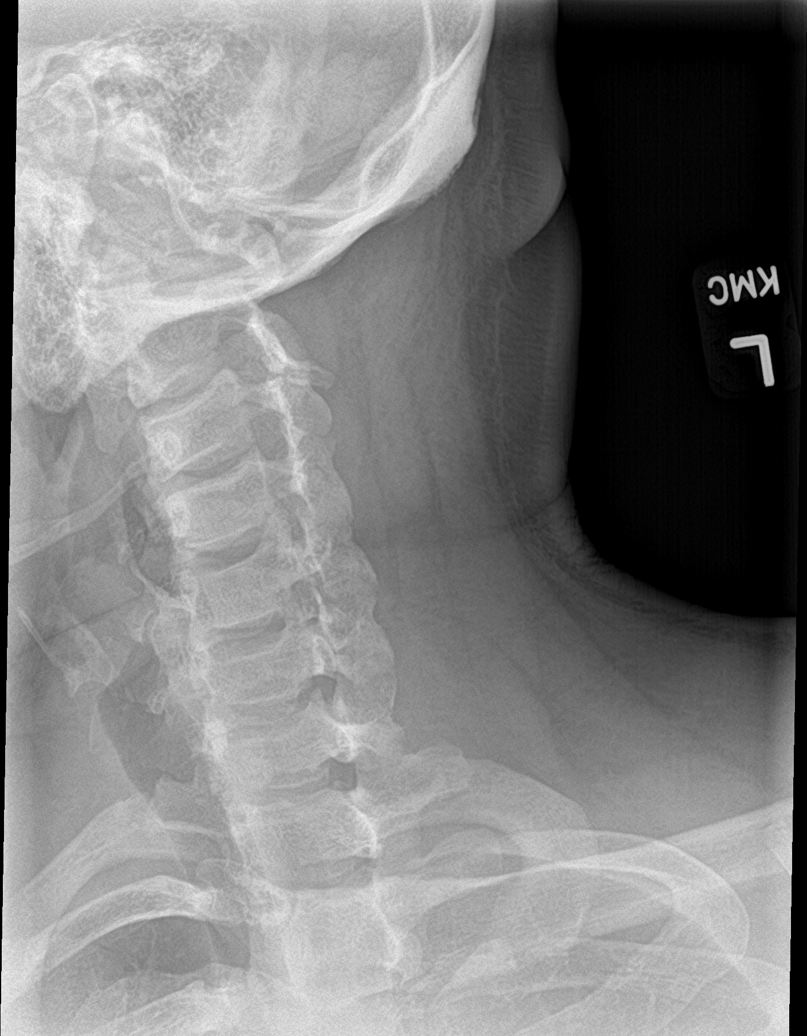

[c-spine ap]
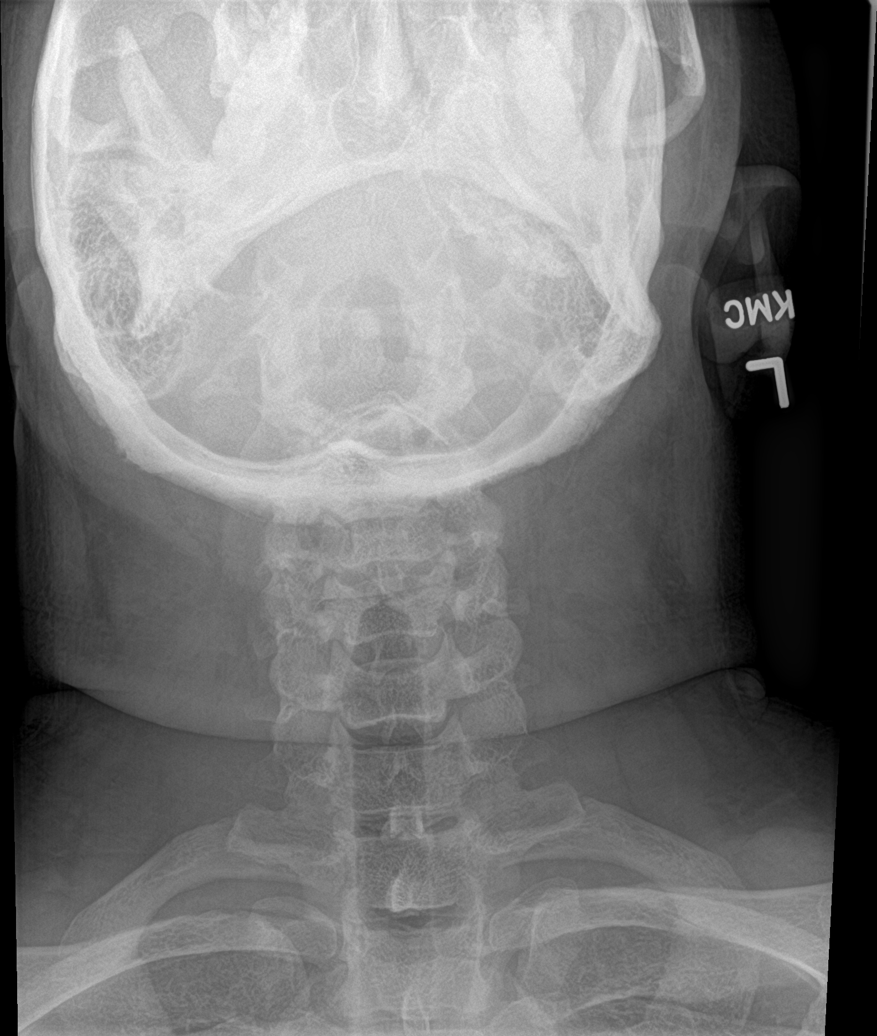

[c-spine open mouth]
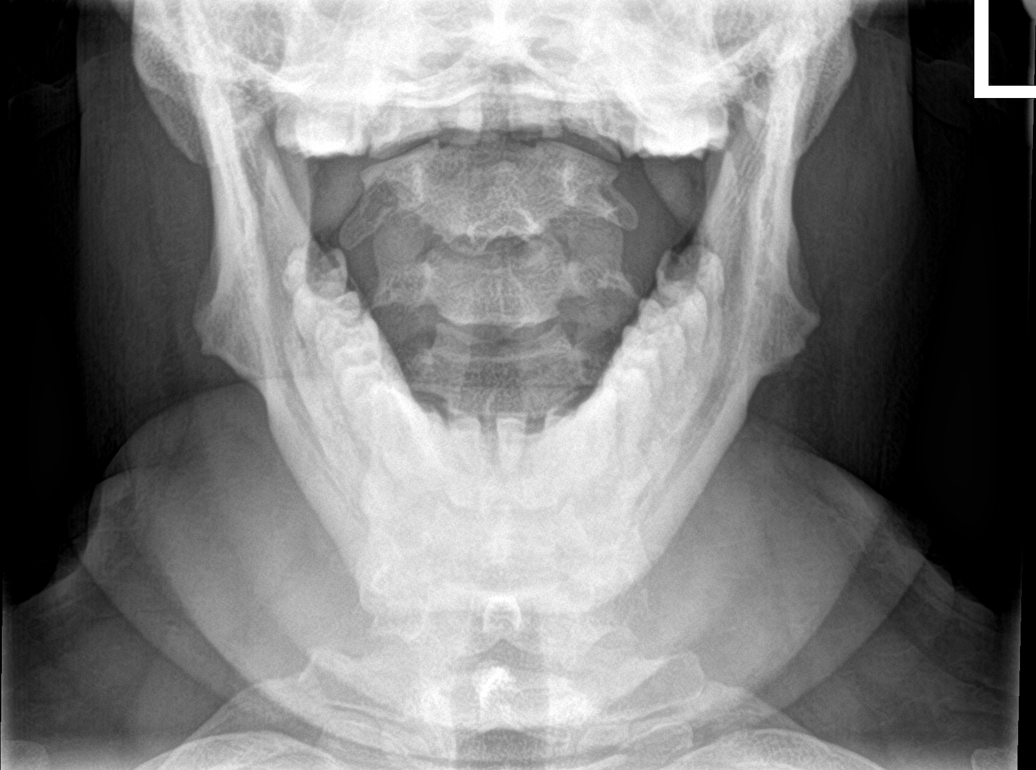

[c-spine swimmers]
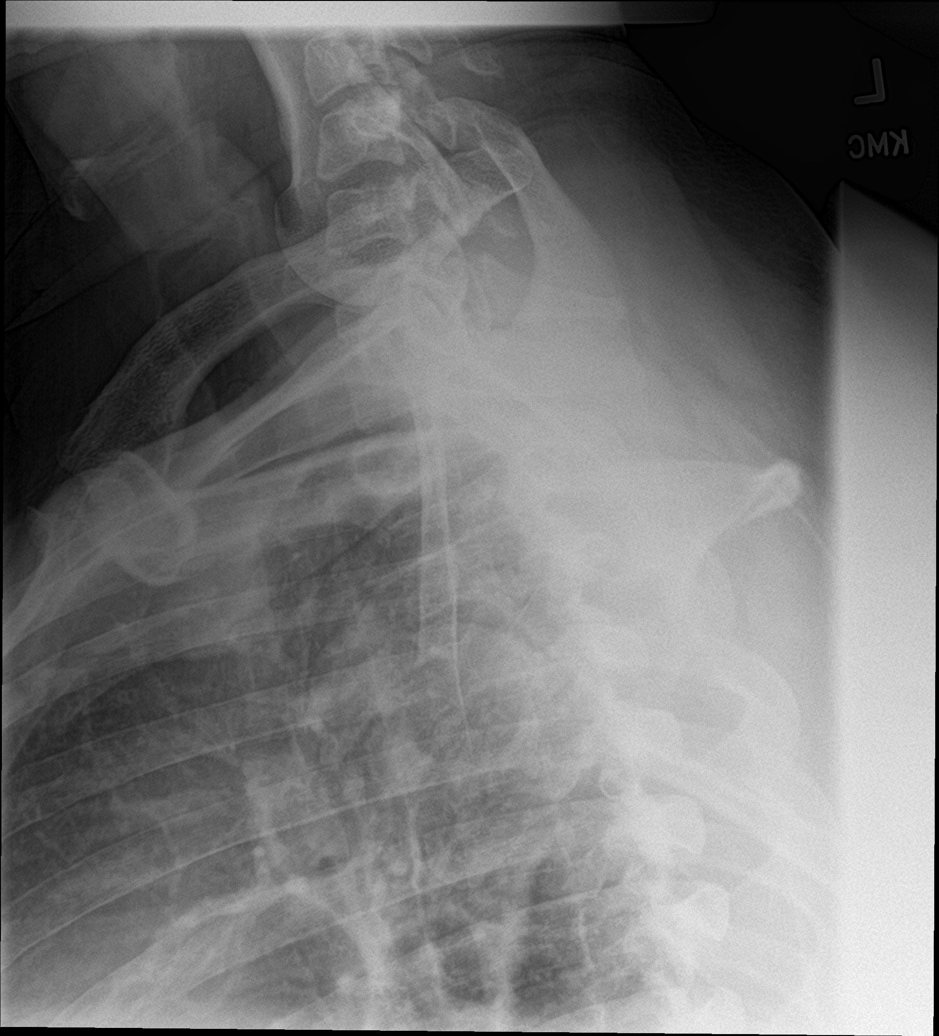

[6 of 6 positions shown; findings below may reference images not displayed]

FINDINGS: There is no evidence of cervical spine fracture or prevertebral soft
tissue swelling. Alignment is normal. No other significant bone
abnormalities are identified.
IMPRESSION: No fracture or static subluxation of the cervical spine. Disc spaces
and vertebral body heights are preserved. Cervical disc and neural
foraminal pathology may be further evaluated by MRI if indicated by
neurologically localizing signs and symptoms.
